# Patient Record
Sex: Female | Born: 1995 | Race: White | Hispanic: No | Marital: Single | Smoking: Never smoker
Health system: Southern US, Community
[De-identification: ages and names within clinical notes are randomized; demographics above are authoritative.]

## PROBLEM LIST (undated history)

## (undated) DIAGNOSIS — F329 Major depressive disorder, single episode, unspecified: Secondary | ICD-10-CM

## (undated) DIAGNOSIS — K829 Disease of gallbladder, unspecified: Secondary | ICD-10-CM

## (undated) DIAGNOSIS — F32A Depression, unspecified: Secondary | ICD-10-CM

## (undated) DIAGNOSIS — E669 Obesity, unspecified: Secondary | ICD-10-CM

## (undated) DIAGNOSIS — F419 Anxiety disorder, unspecified: Secondary | ICD-10-CM

## (undated) HISTORY — DX: Depression, unspecified: F32.A

## (undated) HISTORY — DX: Disease of gallbladder, unspecified: K82.9

## (undated) HISTORY — DX: Major depressive disorder, single episode, unspecified: F32.9

## (undated) HISTORY — DX: Obesity, unspecified: E66.9

## (undated) HISTORY — DX: Anxiety disorder, unspecified: F41.9

---
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
## 2016-01-11 ENCOUNTER — Ambulatory Visit (INDEPENDENT_AMBULATORY_CARE_PROVIDER_SITE_OTHER): Payer: Managed Care, Other (non HMO) | Admitting: Family

## 2016-01-11 ENCOUNTER — Encounter: Payer: Self-pay | Admitting: Family

## 2016-01-11 VITALS — BP 118/82 | HR 84 | Temp 98.2°F | Resp 14 | Ht 63.02 in | Wt 192.0 lb

## 2016-01-11 DIAGNOSIS — N946 Dysmenorrhea, unspecified: Secondary | ICD-10-CM | POA: Diagnosis not present

## 2016-01-11 DIAGNOSIS — Z23 Encounter for immunization: Secondary | ICD-10-CM | POA: Diagnosis not present

## 2016-01-11 DIAGNOSIS — K580 Irritable bowel syndrome with diarrhea: Secondary | ICD-10-CM | POA: Diagnosis not present

## 2016-01-11 NOTE — Assessment & Plan Note (Signed)
Dysmenorrhea with increasing cramping. Discussed potential options including symptom management and birth control. Wishes to continue with symptom management at this time. Continue to monitor.

## 2016-01-11 NOTE — Assessment & Plan Note (Signed)
IBS well controlled with current regimen and no adverse side effects. Continue current dosage of Viberzi and continue to monitor.

## 2016-01-11 NOTE — Patient Instructions (Addendum)
Thank you for choosing [facility].  SUMMARY AND INSTRUCTIONS:  Medication:  Please continue to take your medication as prescribed.  Your prescription(s) have been submitted to your pharmacy or been printed and provided for you. Please take as directed and contact our office if you believe you are having problem(s) with the medication(s) or have any questions.  Follow up:  If your symptoms worsen or fail to improve, please contact our office for further instruction, or in case of emergency go directly to the emergency room at the closest medical facility.    Dysmenorrhea Menstrual cramps (dysmenorrhea) are caused by the muscles of the uterus tightening (contracting) during a menstrual period. For some women, this discomfort is merely bothersome. For others, dysmenorrhea can be severe enough to interfere with everyday activities for a few days each month. Primary dysmenorrhea is menstrual cramps that last a couple of days when you start having menstrual periods or soon after. This often begins after a teenager starts having her period. As a woman gets older or has a baby, the cramps will usually lessen or disappear. Secondary dysmenorrhea begins later in life, lasts longer, and the pain may be stronger than primary dysmenorrhea. The pain may start before the period and last a few days after the period. What are the causes? Dysmenorrhea is usually caused by an underlying problem, such as:  The tissue lining the uterus grows outside of the uterus in other areas of the body (endometriosis).  The endometrial tissue, which normally lines the uterus, is found in or grows into the muscular walls of the uterus (adenomyosis).  The pelvic blood vessels are engorged with blood just before the menstrual period (pelvic congestive syndrome).  Overgrowth of cells (polyps) in the lining of the uterus or cervix.  Falling down of the uterus (prolapse) because of loose or stretched  ligaments.  Depression.  Bladder problems, infection, or inflammation.  Problems with the intestine, a tumor, or irritable bowel syndrome.  Cancer of the female organs or bladder.  A severely tipped uterus.  A very tight opening or closed cervix.  Noncancerous tumors of the uterus (fibroids).  Pelvic inflammatory disease (PID).  Pelvic scarring (adhesions) from a previous surgery.  Ovarian cyst.  An intrauterine device (IUD) used for birth control. What increases the risk? You may be at greater risk of dysmenorrhea if:  You are younger than age 19.  You started puberty early.  You have irregular or heavy bleeding.  You have never given birth.  You have a family history of this problem.  You are a smoker. What are the signs or symptoms?  Cramping or throbbing pain in your lower abdomen.  Headaches.  Lower back pain.  Nausea or vomiting.  Diarrhea.  Sweating or dizziness.  Loose stools. How is this diagnosed? A diagnosis is based on your history, symptoms, physical exam, diagnostic tests, or procedures. Diagnostic tests or procedures may include:  Blood tests.  Ultrasonography.  An examination of the lining of the uterus (dilation and curettage, D&C).  An examination inside your abdomen or pelvis with a scope (laparoscopy).  X-rays.  CT scan.  MRI.  An examination inside the bladder with a scope (cystoscopy).  An examination inside the intestine or stomach with a scope (colonoscopy, gastroscopy). How is this treated? Treatment depends on the cause of the dysmenorrhea. Treatment may include:  Pain medicine prescribed by your health care provider.  Birth control pills or an IUD with progesterone hormone in it.  Hormone replacement therapy.  Nonsteroidal anti-inflammatory drugs (NSAIDs). These may help stop the production of prostaglandins.  Surgery to remove adhesions, endometriosis, ovarian cyst, or fibroids.  Removal of the uterus  (hysterectomy).  Progesterone shots to stop the menstrual period.  Cutting the nerves on the sacrum that go to the female organs (presacral neurectomy).  Electric current to the sacral nerves (sacral nerve stimulation).  Antidepressant medicine.  Psychiatric therapy, counseling, or group therapy.  Exercise and physical therapy.  Meditation and yoga therapy.  Acupuncture. Follow these instructions at home:  Only take over-the-counter or prescription medicines as directed by your health care provider.  Place a heating pad or hot water bottle on your lower back or abdomen. Do not sleep with the heating pad.  Use aerobic exercises, walking, swimming, biking, and other exercises to help lessen the cramping.  Massage to the lower back or abdomen may help.  Stop smoking.  Avoid alcohol and caffeine. Contact a health care provider if:  Your pain does not get better with medicine.  You have pain with sexual intercourse.  Your pain increases and is not controlled with medicines.  You have abnormal vaginal bleeding with your period.  You develop nausea or vomiting with your period that is not controlled with medicine. Get help right away if: You pass out. This information is not intended to replace advice given to you by your health care provider. Make sure you discuss any questions you have with your health care provider. Document Released: 01/10/2005 Document Revised: 06/18/2015 Document Reviewed: 06/28/2012 Elsevier Interactive Patient Education  2017 [employer]

## 2016-01-11 NOTE — Progress Notes (Signed)
Subjective:    Patient ID: Patricia Anderson, female    DOB: 1995-12-17, 20 y.o.   MRN: [id number]  Chief Complaint  Patient presents with  . Follow-up    IBS, states that cramps on period have been getting alot worse,     HPI:  Patricia Anderson is a 20 y.o. female who  has a past medical history of Anxiety; Depression; Gallbladder attack; and Obesity. and presents today for a follow up office visit.  1.) IBS - Currently maintained on Viberzi which she reports taking as prescribed and denies adverse side effects. Reports that her symptoms are generally well controlled with medication once daily.   2.) Menstrual cramps - This is a new problem. Associated symptom of increasing menstrual cramps have been going on for the last couple of months that has been refractory to the modifying factors of Midol and a heating pad. Cycles have been changing increasing in length and increased heaviness. Previously 3 days long now up to 6 days with heavy bleeding early and tapering off. Cycles are generally about 28 days with some fluctuation.    No Known Allergies    Outpatient Medications Prior to Visit  Medication Sig Dispense Refill  . busPIRone (BUSPAR) 15 MG tablet Take 2 tablets by mouth in the morning and 1 tablet by mouth in the evening. 90 tablet 0  . Eluxadoline (VIBERZI) 75 MG TABS Take 75 mg by mouth 2 (two) times daily. 180 tablet 1  . FLUoxetine (PROZAC) 40 MG capsule Take 1 capsule (40 mg total) by mouth daily. 90 capsule 1  . triamcinolone ointment (KENALOG) 0.1 % Apply 1 application topically 2 (two) times daily. 30 g 0   No facility-administered medications prior to visit.       No past surgical history on file.    Past Medical History:  Diagnosis Date  . Anxiety    with depression; previous cutting.    . Depression   . Gallbladder attack   . Obesity     Review of Systems  Constitutional: Negative for chills and fever.  Gastrointestinal: Negative for abdominal  distention, abdominal pain, anal bleeding, blood in stool, constipation, diarrhea, nausea, rectal pain and vomiting.  Genitourinary: Positive for menstrual problem.      Objective:    BP 118/82 (BP Location: Left Arm, Patient Position: Sitting, Cuff Size: Normal)   Pulse 84   Temp 98.2 F (36.8 C) (Oral)   Resp 14   Ht 5' 3.02" (1.601 m)   Wt 192 lb (87.1 kg)   SpO2 98%   BMI 33.99 kg/m  Nursing note and vital signs reviewed.  Physical Exam  Constitutional: She is oriented to person, place, and time. She appears well-developed and well-nourished. No distress.  Cardiovascular: Normal rate, regular rhythm, normal heart sounds and intact distal pulses.   Pulmonary/Chest: Effort normal and breath sounds normal.  Abdominal: Normal appearance and bowel sounds are normal. There is no hepatosplenomegaly. There is no tenderness. There is no rigidity, no rebound, no guarding, no tenderness at McBurney's point and negative Murphy's sign.  Neurological: She is alert and oriented to person, place, and time.  Skin: Skin is warm and dry.  Psychiatric: She has a normal mood and affect. Her behavior is normal. Judgment and thought content normal.       Assessment & Plan:   Problem List Items Addressed This Visit      Digestive   IBS (irritable bowel syndrome) - Primary    IBS  well controlled with current regimen and no adverse side effects. Continue current dosage of Viberzi and continue to monitor.         Other   Menstrual cramps    Dysmenorrhea with increasing cramping. Discussed potential options including symptom management and birth control. Wishes to continue with symptom management at this time. Continue to monitor.        Other Visit Diagnoses    Encounter for immunization       Relevant Orders   Flu Vaccine QUAD 36+ mos IM (Completed)       I am having Ms. Gean Quintielsen maintain her Eluxadoline, busPIRone, triamcinolone ointment, and FLUoxetine.   Follow-up: Return in about  6 months (around 07/11/2016), or if symptoms worsen or fail to improve.  Jeanine Luzalone, Curtis Uriarte, FNP

## 2016-02-17 ENCOUNTER — Encounter (HOSPITAL_COMMUNITY): Payer: Self-pay | Admitting: Psychology

## 2016-02-17 NOTE — Progress Notes (Signed)
Patricia Anderson is a 21 y.o. female patient who is discharged from counseling as last seen 07/21/15 and had planned to transistion care in fall 2017 as moving to attend college .  Outpatient Therapist Discharge Summary  Patricia Anderson    [birth year]   Admission Date: 05/02/13   Discharge Date:  02/17/16 Reason for Discharge:  Not active in tx Diagnosis:  MDD, in remission and GAD  Comments:  Pt may return as needed  Alfredo BattyLeanne M Dalten Ambrosino        Patricia Anderson, [facility]

## 2016-02-22 ENCOUNTER — Telehealth: Payer: Self-pay

## 2016-02-22 NOTE — Telephone Encounter (Signed)
PA initiated for viberzi. Waiting on response.   KEY: [id number]

## 2016-02-24 NOTE — Telephone Encounter (Signed)
PA has been approved until 02/21/2017. Pharmacy is aware.

## 2016-03-13 ENCOUNTER — Other Ambulatory Visit: Payer: Self-pay | Admitting: Family

## 2016-03-13 DIAGNOSIS — F411 Generalized anxiety disorder: Secondary | ICD-10-CM

## 2016-04-18 ENCOUNTER — Other Ambulatory Visit: Payer: Self-pay | Admitting: Family

## 2016-04-18 DIAGNOSIS — F411 Generalized anxiety disorder: Secondary | ICD-10-CM

## 2016-05-13 ENCOUNTER — Ambulatory Visit: Payer: Managed Care, Other (non HMO) | Admitting: Family

## 2016-05-16 ENCOUNTER — Other Ambulatory Visit: Payer: Self-pay | Admitting: Family

## 2016-05-16 DIAGNOSIS — F411 Generalized anxiety disorder: Secondary | ICD-10-CM

## 2016-06-20 ENCOUNTER — Other Ambulatory Visit: Payer: Self-pay | Admitting: Family

## 2016-06-20 DIAGNOSIS — F411 Generalized anxiety disorder: Secondary | ICD-10-CM

## 2016-07-22 ENCOUNTER — Other Ambulatory Visit: Payer: Self-pay | Admitting: Family

## 2016-07-22 DIAGNOSIS — F411 Generalized anxiety disorder: Secondary | ICD-10-CM

## 2016-11-11 ENCOUNTER — Encounter: Payer: Managed Care, Other (non HMO) | Admitting: Family

## 2016-12-09 ENCOUNTER — Ambulatory Visit: Payer: Managed Care, Other (non HMO) | Admitting: Nurse Practitioner

## 2016-12-09 ENCOUNTER — Encounter: Payer: Self-pay | Admitting: Nurse Practitioner

## 2016-12-09 ENCOUNTER — Other Ambulatory Visit: Payer: Self-pay

## 2016-12-09 VITALS — BP 122/82 | HR 69 | Temp 98.0°F | Resp 16 | Ht 63.5 in | Wt 182.0 lb

## 2016-12-09 DIAGNOSIS — Z0000 Encounter for general adult medical examination without abnormal findings: Secondary | ICD-10-CM

## 2016-12-09 DIAGNOSIS — F411 Generalized anxiety disorder: Secondary | ICD-10-CM

## 2016-12-09 DIAGNOSIS — Z114 Encounter for screening for human immunodeficiency virus [HIV]: Secondary | ICD-10-CM | POA: Diagnosis not present

## 2016-12-09 DIAGNOSIS — F330 Major depressive disorder, recurrent, mild: Secondary | ICD-10-CM

## 2016-12-09 MED ORDER — FLUOXETINE HCL 40 MG PO CAPS: 40.0000 mg | ORAL_CAPSULE | Freq: Every day | ORAL | 1 refills | Status: AC

## 2016-12-09 NOTE — Progress Notes (Signed)
Subjective:    Patient ID: Patricia Anderson, female    DOB: 1995-05-24, 21 y.o.   MRN: [id number]  HPI Patricia Anderson is a 21 yo female Who presents today to establish care, transferring to me from another provider in the same clinic. 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 He is followed by a specialty provider in [place] that is helping him convert from female to female. He receives testosterone and routine blood work at this clinic. Patient presents today for complete physical.  Immunizations: Tetanus- up to date Flu- up to date Vision: annual Dental: bi annual Diet: Breakfast: nothing Lunch: Malawiturkey sandwich Dinner: cooks and eats out half and half Fast food 2-3 times a week; drinks: diet dr pepper, not much water Exercise: 2-3 times per week-total gym at home, jogging Sunscreen- no seatbelt - yes  Review of Systems  Constitutional: Negative.  Negative for activity change and appetite change.  HENT: Positive for congestion. Negative for dental problem.   Eyes: Negative for visual disturbance.  Respiratory: Positive for cough. Negative for shortness of breath.   Cardiovascular: Negative for chest pain and palpitations.  Gastrointestinal: Negative for constipation and diarrhea.  Endocrine: Negative for cold intolerance and heat intolerance.  Genitourinary: Negative for difficulty urinating and hematuria.  Musculoskeletal: Negative for arthralgias and myalgias.  Skin: Negative for rash.  Allergic/Immunologic: Positive for environmental allergies. Negative for food allergies.  Neurological: Negative for speech difficulty and headaches.  Hematological: Does not bruise/bleed easily.  Psychiatric/Behavioral:       Mild depression and anxiety.    Past Medical History:  Diagnosis Date  . Anxiety    with depression; previous cutting.    . Depression   . Gallbladder attack   . Obesity      Social History    Socioeconomic History  . Marital status: Single    Spouse name: Not on file  . Number of children: 0  . Years of education: 5511  . Highest education level: Not on file  Social Needs  . Financial resource strain: Not on file  . Food insecurity - worry: Not on file  . Food insecurity - inability: Not on file  . Transportation needs - medical: No  . Transportation needs - non-medical: No  Occupational History  . Occupation: Consulting civil engineertudent    Comment: [employer]  . Occupation: work    Comment: [employer]  Tobacco Use  . Smoking status: Never Smoker  . Smokeless tobacco: Never Used  Substance and Sexual Activity  . Alcohol use: No  . Drug use: No  . Sexual activity: Yes    Partners: Female    Birth control/protection: None    Comment: TG  Other Topics Concern  . Not on file  Social History Narrative   With whom does your teen live - mother   Does your teen smoke, drink alcohol, or use illegal drugs - No   Current school and grade - 11   Does not exercise   Family history - hypertension/heart disease/ high cholesterol   Mental illness and hemophelia   Have you discussed the following with your teen - sexual behavior, seat belt, bike helmet   internet use, alcohol/drug abuse, safe driving   She is caring and understanding   She is also a straight A student   FUN: school events, hang out with friends    History reviewed. No pertinent surgical history.  Family History  Problem Relation  Age of Onset  . Anxiety disorder Father   . Depression Father   . Hemophilia Father   . Depression Maternal Aunt   . Anxiety disorder Maternal Aunt   . Depression Paternal Aunt   . Anxiety disorder Paternal Aunt   . Anxiety disorder Cousin   . Depression Cousin     No Known Allergies  Current Outpatient Medications on File Prior to Visit  Medication Sig Dispense Refill  . busPIRone (BUSPAR) 15 MG tablet TAKE 2 TABLETS BY MOUTH IN THE MORNING AND 1 TABLET BY MOUTH IN THE EVENING. 90  tablet 0  . Crisaborole (EUCRISA) 2 % OINT Apply topically.    Marland Kitchen. FLUoxetine (PROZAC) 40 MG capsule Take 1 capsule (40 mg total) by mouth daily. 90 capsule 1  . testosterone cypionate (DEPOTESTOSTERONE CYPIONATE) 200 MG/ML injection Inject 200 mg every 14 (fourteen) days into the muscle. 0.6 mls every 2 weeks    . triamcinolone ointment (KENALOG) 0.1 % Apply 1 application topically 2 (two) times daily. 30 g 0   No current facility-administered medications on file prior to visit.     BP 122/82 (BP Location: Left Arm, Patient Position: Sitting, Cuff Size: Large)   Pulse 69   Temp 98 F (36.7 C) (Oral)   Resp 16   Ht 5' 3.5" (1.613 m)   Wt 182 lb (82.6 kg)   SpO2 97%   BMI 31.73 kg/m     Past Medical History:  Diagnosis Date  . Anxiety    with depression; previous cutting.    . Depression   . Gallbladder attack   . Obesity      Social History   Socioeconomic History  . Marital status: Single    Spouse name: Not on file  . Number of children: 0  . Years of education: 2411  . Highest education level: Not on file  Social Needs  . Financial resource strain: Not on file  . Food insecurity - worry: Not on file  . Food insecurity - inability: Not on file  . Transportation needs - medical: No  . Transportation needs - non-medical: No  Occupational History  . Occupation: Consulting civil engineertudent    Comment: [employer]  . Occupation: work    Comment: food [employer]  Tobacco Use  . Smoking status: Never Smoker  . Smokeless tobacco: Never Used  Substance and Sexual Activity  . Alcohol use: No  . Drug use: No  . Sexual activity: Yes    Partners: Female    Birth control/protection: None    Comment: TG  Other Topics Concern  . Not on file  Social History Narrative   With whom does your teen live - mother   Does your teen smoke, drink alcohol, or use illegal drugs - No   Current school and grade - 11   Does not exercise   Family history - hypertension/heart disease/ high cholesterol   Mental  illness and hemophelia   Have you discussed the following with your teen - sexual behavior, seat belt, bike helmet   internet use, alcohol/drug abuse, safe driving   She is caring and understanding   She is also a straight A student   FUN: school events, hang out with friends    History reviewed. No pertinent surgical history.  Family History  Problem Relation Age of Onset  . Anxiety disorder Father   . Depression Father   . Hemophilia Father   . Depression Maternal Aunt   . Anxiety disorder Maternal Aunt   .  Depression Paternal Aunt   . Anxiety disorder Paternal Aunt   . Anxiety disorder Cousin   . Depression Cousin     No Known Allergies  Current Outpatient Medications on File Prior to Visit  Medication Sig Dispense Refill  . busPIRone (BUSPAR) 15 MG tablet TAKE 2 TABLETS BY MOUTH IN THE MORNING AND 1 TABLET BY MOUTH IN THE EVENING. 90 tablet 0  . Crisaborole (EUCRISA) 2 % OINT Apply topically.    Marland Kitchen FLUoxetine (PROZAC) 40 MG capsule Take 1 capsule (40 mg total) by mouth daily. 90 capsule 1  . testosterone cypionate (DEPOTESTOSTERONE CYPIONATE) 200 MG/ML injection Inject 200 mg every 14 (fourteen) days into the muscle. 0.6 mls every 2 weeks    . triamcinolone ointment (KENALOG) 0.1 % Apply 1 application topically 2 (two) times daily. 30 g 0   No current facility-administered medications on file prior to visit.        Objective:   Physical Exam  BP 122/82 (BP Location: Left Arm, Patient Position: Sitting, Cuff Size: Large)   Pulse 69   Temp 98 F (36.7 C) (Oral)   Resp 16   Ht 5' 3.5" (1.613 m)   Wt 182 lb (82.6 kg)   SpO2 97%   BMI 31.73 kg/m   General Appearance:    Alert, cooperative, no distress, appears stated age  Head:    Normocephalic, without obvious abnormality, atraumatic  Eyes:    PERRL, conjunctiva/corneas clear, EOM's intact  Ears:    Normal TM's and external ear canals, both ears  Nose:   Nares normal, septum midline, mucosa normal, no drainage     or sinus tenderness  Throat:   Lips, mucosa, and tongue normal; teeth and gums normal  Neck:   Supple, symmetrical, trachea midline, no adenopathy;    thyroid:  no enlargement/tenderness/nodules  Back:     Symmetric, no curvature, ROM normal, no CVA tenderness  Lungs:     Clear to auscultation bilaterally, respirations unlabored  Chest Wall:    No tenderness or deformity   Heart:    Regular rate and rhythm, heart sounds normal, no murmur, rub or gallop     Abdomen:     Soft, non-tender, bowel sounds active all four quadrants,    no masses, no organomegaly        Extremities:   Extremities normal, atraumatic, no cyanosis or edema  Pulses:   2+ and symmetric all extremities  Skin:   Skin color, texture, turgor normal, no rashes or lesions  Lymph nodes:   Cervical, supraclavicular nodes normal  Neurologic:   CNII-XII intact, normal strength, sensation and reflexes    throughout        Assessment & Plan:

## 2016-12-09 NOTE — Assessment & Plan Note (Signed)
Immunizations up to date. HIV antibody today-ordered through [facility] per patient request. Health maintenance up to date.  Healthy diet and exercise discussed including reduced 1/2 plate of veggies at meals, getting 150 minutes of exercise per week, increased water intake, and not skipping meals. Sunscreen, seatbelt, smoking cessation discussed. He currently vapes is thinking of quitting.  Preventive care handout given.  Hell return in 1 year for CPE or sooner if needed

## 2016-12-09 NOTE — Patient Instructions (Signed)
I have printed your lab order to take to [facility]. They should fax the results back to me.  Please work on your diet and exercise as we discussed. Remember half of your plate should be veggies, one-fourth carbs, one-fourth meat, and don't eat meat at every meal. Also, remember to stay away from sugary drinks. I'd like for you to start incorporating exercise into your daily schedule. Start at 10 minutes a day, working up to 30 minutes five times a week.  It was very nice to meet you. Thanks for letting me take care of you today :)   Preventive Care 18-39 Years Preventive care refers to lifestyle choices and visits with your health care provider that can promote health and wellness. What does preventive care include?  A yearly physical exam. This is also called an annual well check.  Dental exams once or twice a year.  Routine eye exams. Ask your health care provider how often you should have your eyes checked.  Personal lifestyle choices, including: ? Daily care of your teeth and gums. ? Regular physical activity. ? Eating a healthy diet. ? Avoiding tobacco and drug use. ? Limiting alcohol use. ? Practicing safe sex. ? Taking vitamin and mineral supplements as recommended by your health care provider. What happens during an annual well check? The services and screenings done by your health care provider during your annual well check will depend on your age, overall health, lifestyle risk factors, and family history of disease. Counseling Your health care provider may ask you questions about your:  Alcohol use.  Tobacco use.  Drug use.  Emotional well-being.  Home and relationship well-being.  Sexual activity.  Eating habits.  Work and work Statistician.  Method of birth control.  Menstrual cycle.  Pregnancy history.  Screening You may have the following tests or measurements:  Height, weight, and BMI.  Diabetes screening. This is done by checking your blood  sugar (glucose) after you have not eaten for a while (fasting).  Blood pressure.  Lipid and cholesterol levels. These may be checked every 5 years starting at age 29.  Skin check.  Hepatitis C blood test.  Hepatitis B blood test.  Sexually transmitted disease (STD) testing.  BRCA-related cancer screening. This may be done if you have a family history of breast, ovarian, tubal, or peritoneal cancers.  Pelvic exam and Pap test. This may be done every 3 years starting at age 74. Starting at age 73, this may be done every 5 years if you have a Pap test in combination with an HPV test.  Discuss your test results, treatment options, and if necessary, the need for more tests with your health care provider. Vaccines Your health care provider may recommend certain vaccines, such as:  Influenza vaccine. This is recommended every year.  Tetanus, diphtheria, and acellular pertussis (Tdap, Td) vaccine. You may need a Td booster every 10 years.  Varicella vaccine. You may need this if you have not been vaccinated.  HPV vaccine. If you are 70 or younger, you may need three doses over 6 months.  Measles, mumps, and rubella (MMR) vaccine. You may need at least one dose of MMR. You may also need a second dose.  Pneumococcal 13-valent conjugate (PCV13) vaccine. You may need this if you have certain conditions and were not previously vaccinated.  Pneumococcal polysaccharide (PPSV23) vaccine. You may need one or two doses if you smoke cigarettes or if you have certain conditions.  Meningococcal vaccine. One dose is recommended  if you are age 15-21 years and a first-year college student living in a residence hall, or if you have one of several medical conditions. You may also need additional booster doses.  Hepatitis A vaccine. You may need this if you have certain conditions or if you travel or work in places where you may be exposed to hepatitis A.  Hepatitis B vaccine. You may need this if you  have certain conditions or if you travel or work in places where you may be exposed to hepatitis B.  Haemophilus influenzae type b (Hib) vaccine. You may need this if you have certain risk factors.  Talk to your health care provider about which screenings and vaccines you need and how often you need them. This information is not intended to replace advice given to you by your health care provider. Make sure you discuss any questions you have with your health care provider. Document Released: 03/08/2001 Document Revised: 09/30/2015 Document Reviewed: 11/11/2014 Elsevier Interactive Patient Education  2017 [employer].

## 2017-02-17 ENCOUNTER — Telehealth: Payer: Self-pay | Admitting: Family

## 2017-02-17 NOTE — Telephone Encounter (Signed)
Just a reminder that he and [name] had talked about getting HIV test at [facility] per his request; still have not gotten results here. If already had done with other provider in [place], no need for further follow-up.

## 2018-02-24 IMAGING — CR DG FOOT COMPLETE 3+V*L*
3 series · 3 of 3 positions shown · non-contrast
Comparison: None.

CLINICAL DATA: Acute left foot pain after twisting injury today.
Initial encounter.

EXAM:
LEFT FOOT - COMPLETE 3+ VIEW

[x foot ap left]
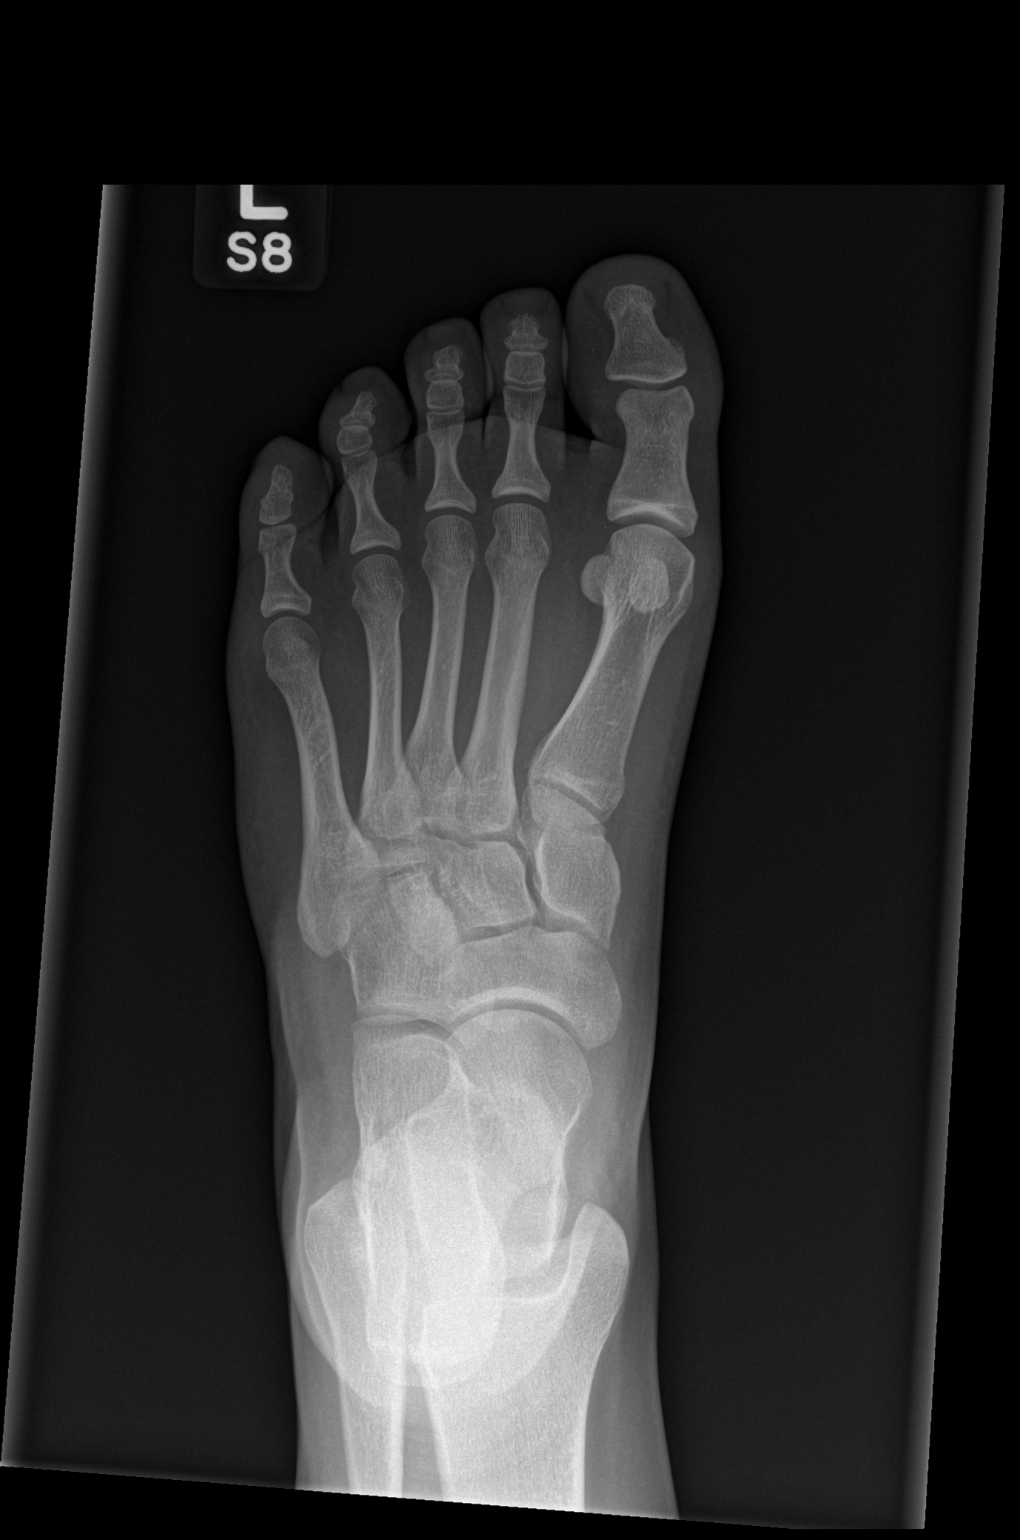

[x foot obl left]
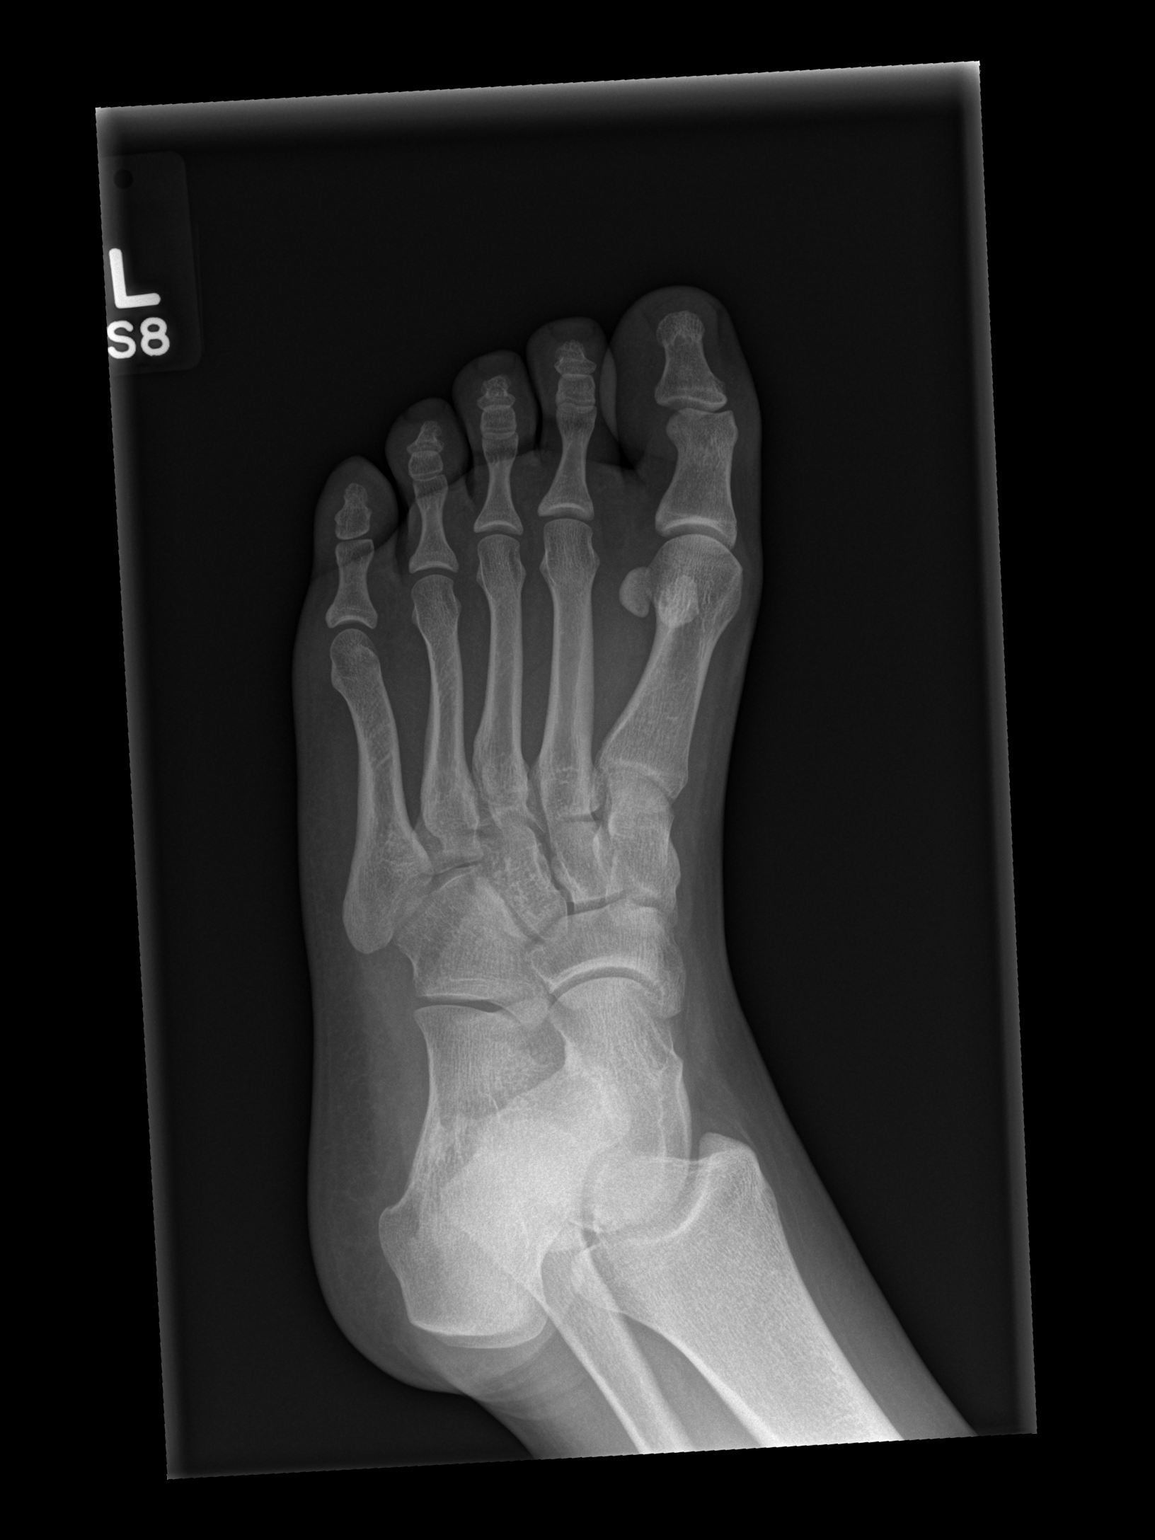

[x foot lat left]
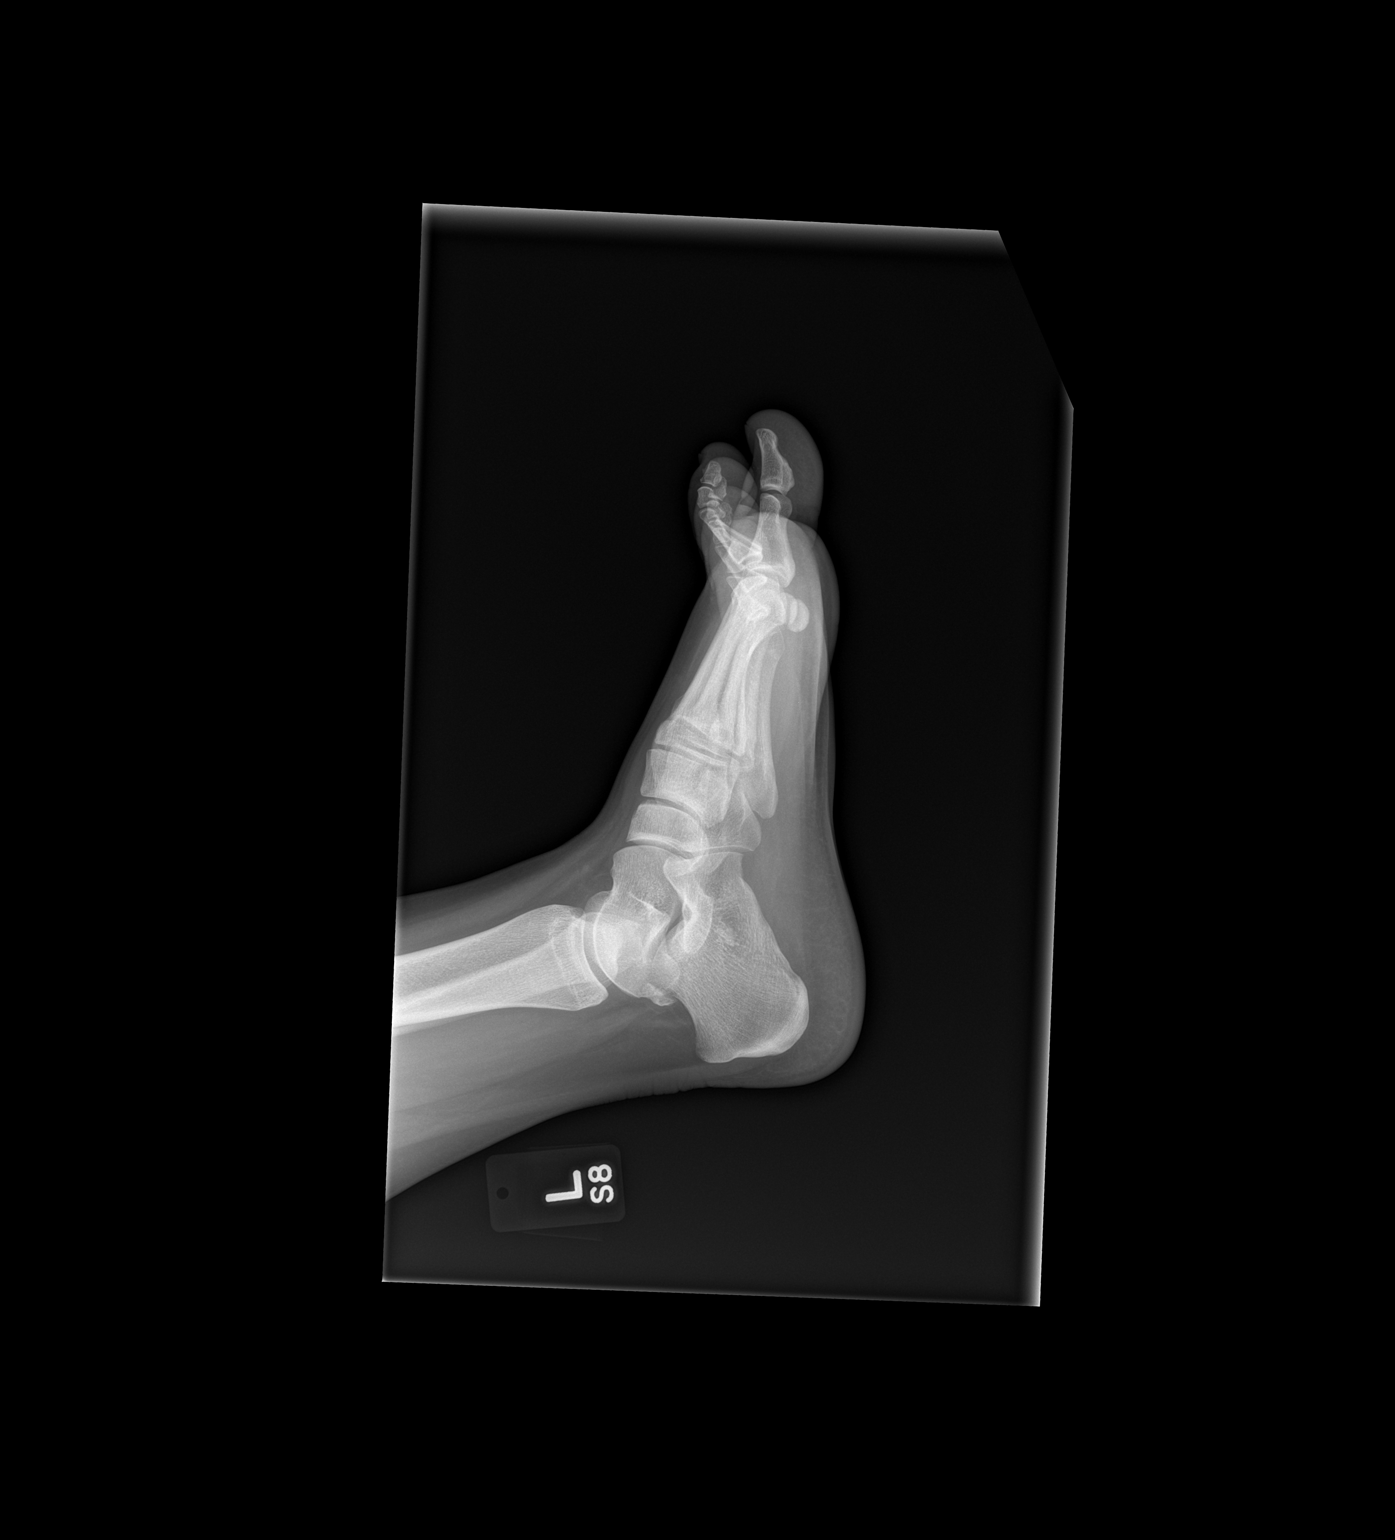

[3 of 3 positions shown; findings below may reference images not displayed]

FINDINGS: There is no evidence of fracture or dislocation. There is no
evidence of arthropathy or other focal bone abnormality. Soft
tissues are unremarkable.
IMPRESSION: Normal left foot.

## 2018-02-24 IMAGING — CR DG ANKLE COMPLETE 3+V*L*
3 series · 3 of 3 positions shown · non-contrast
Comparison: None.

CLINICAL DATA: Fall with left ankle and foot pain and swelling.

EXAM:
LEFT ANKLE COMPLETE - 3+ VIEW

[x ankle ap left]
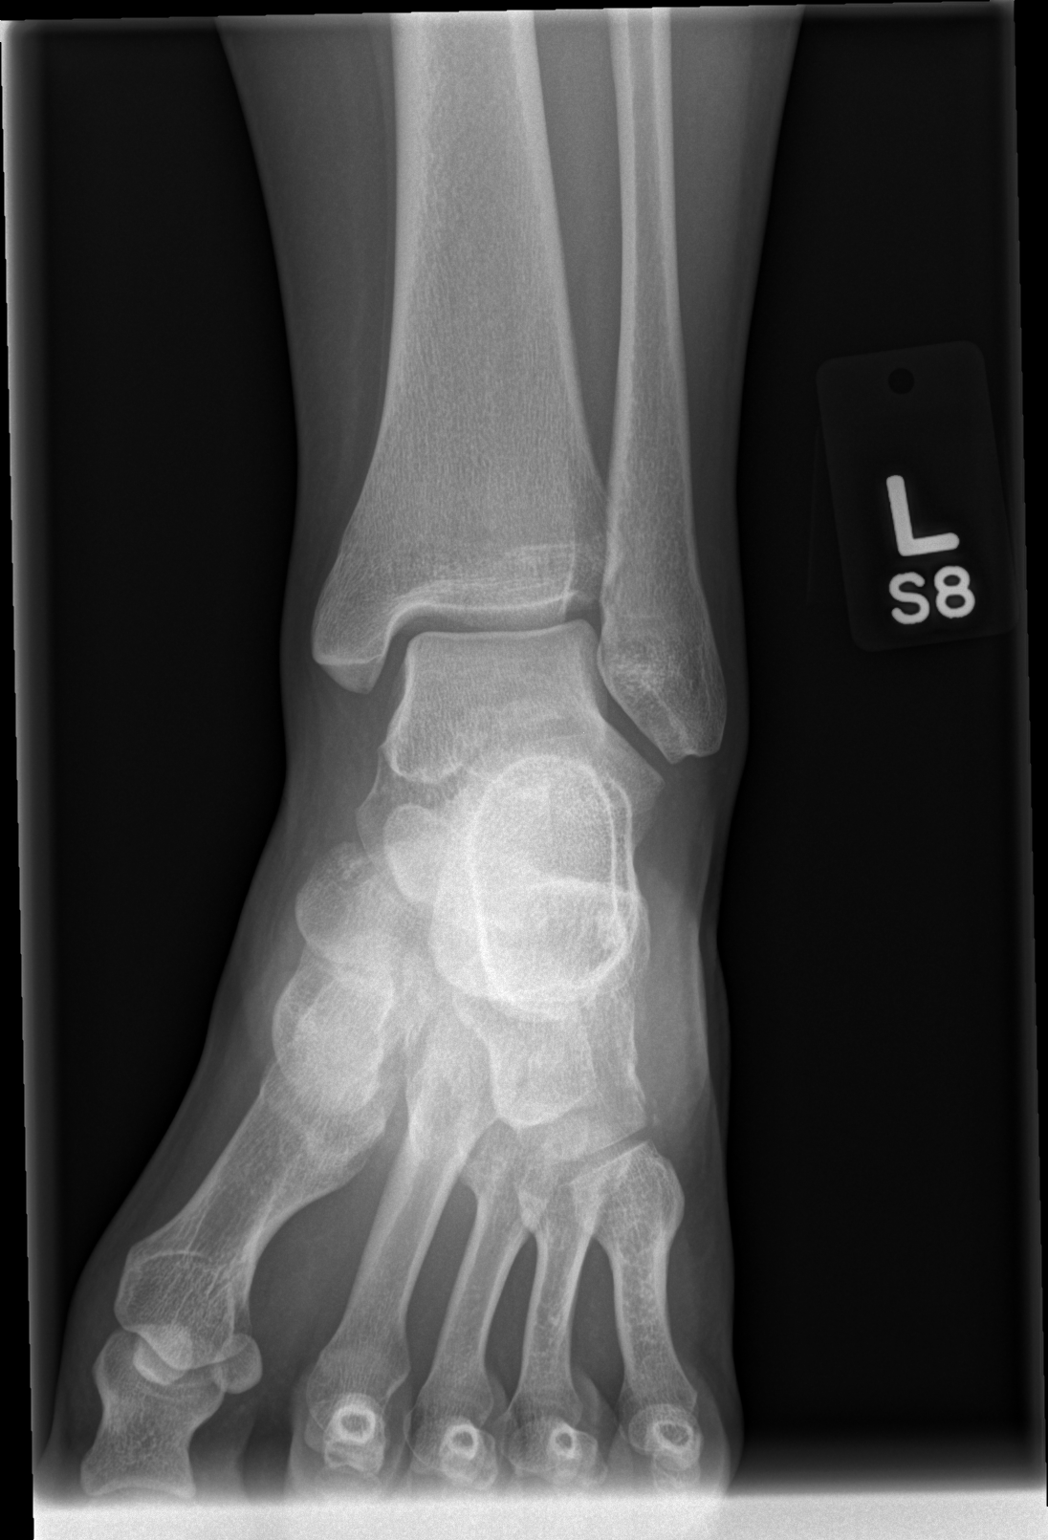

[x ankle obl left]
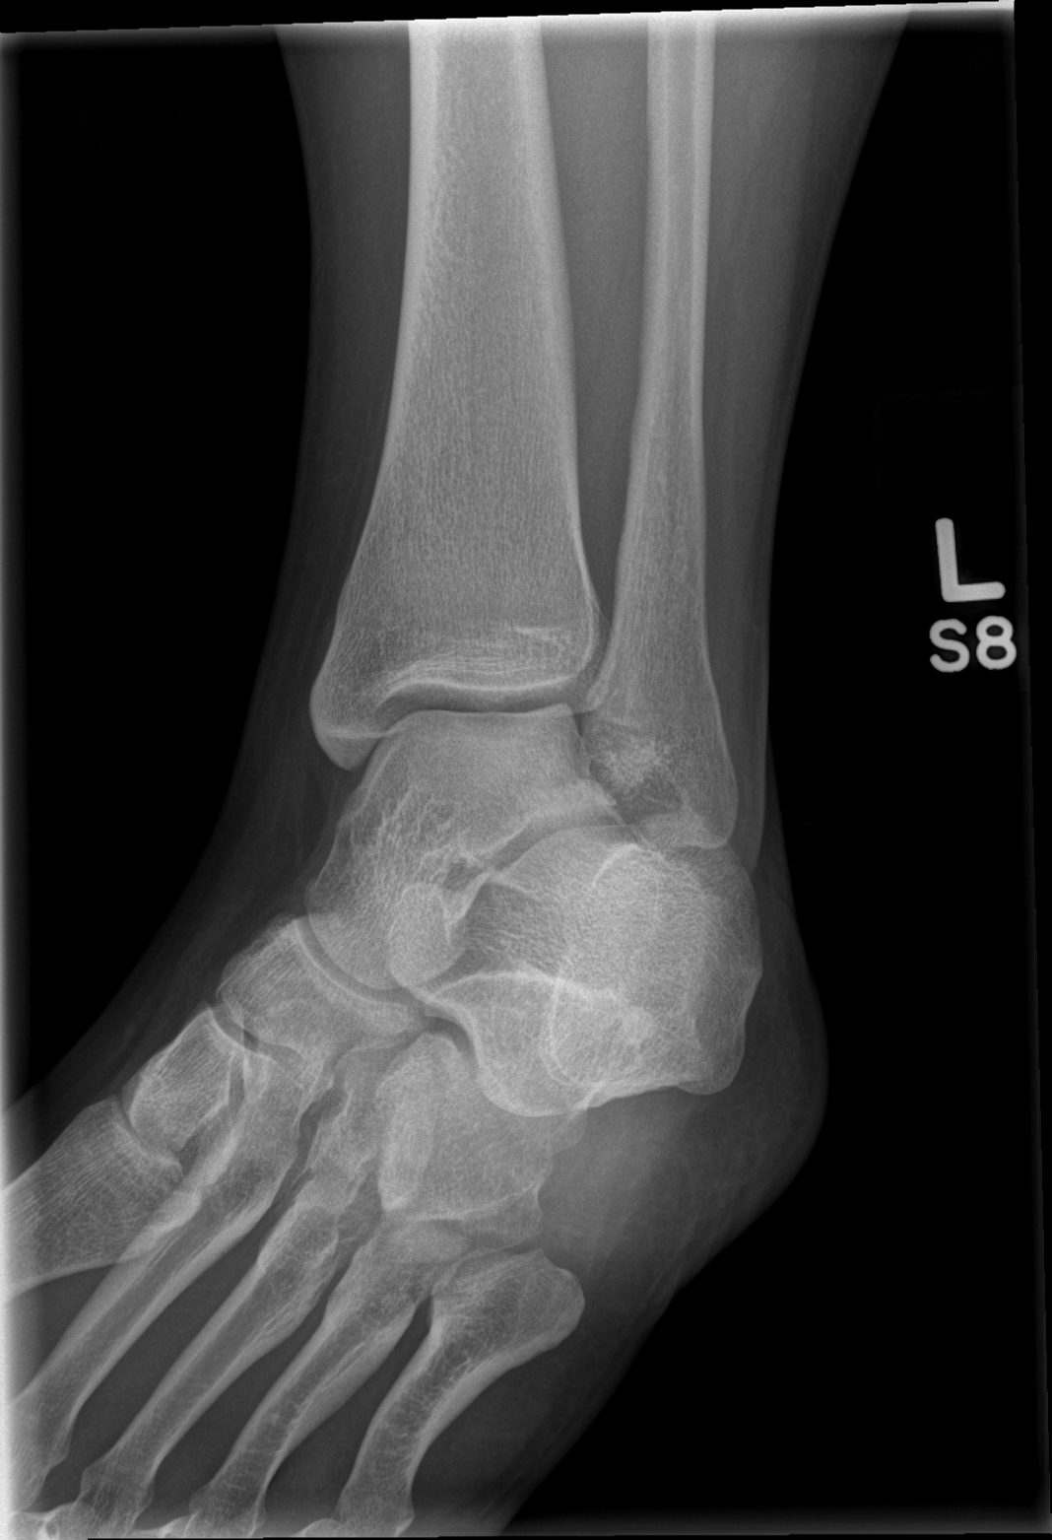

[x ankle lat left]
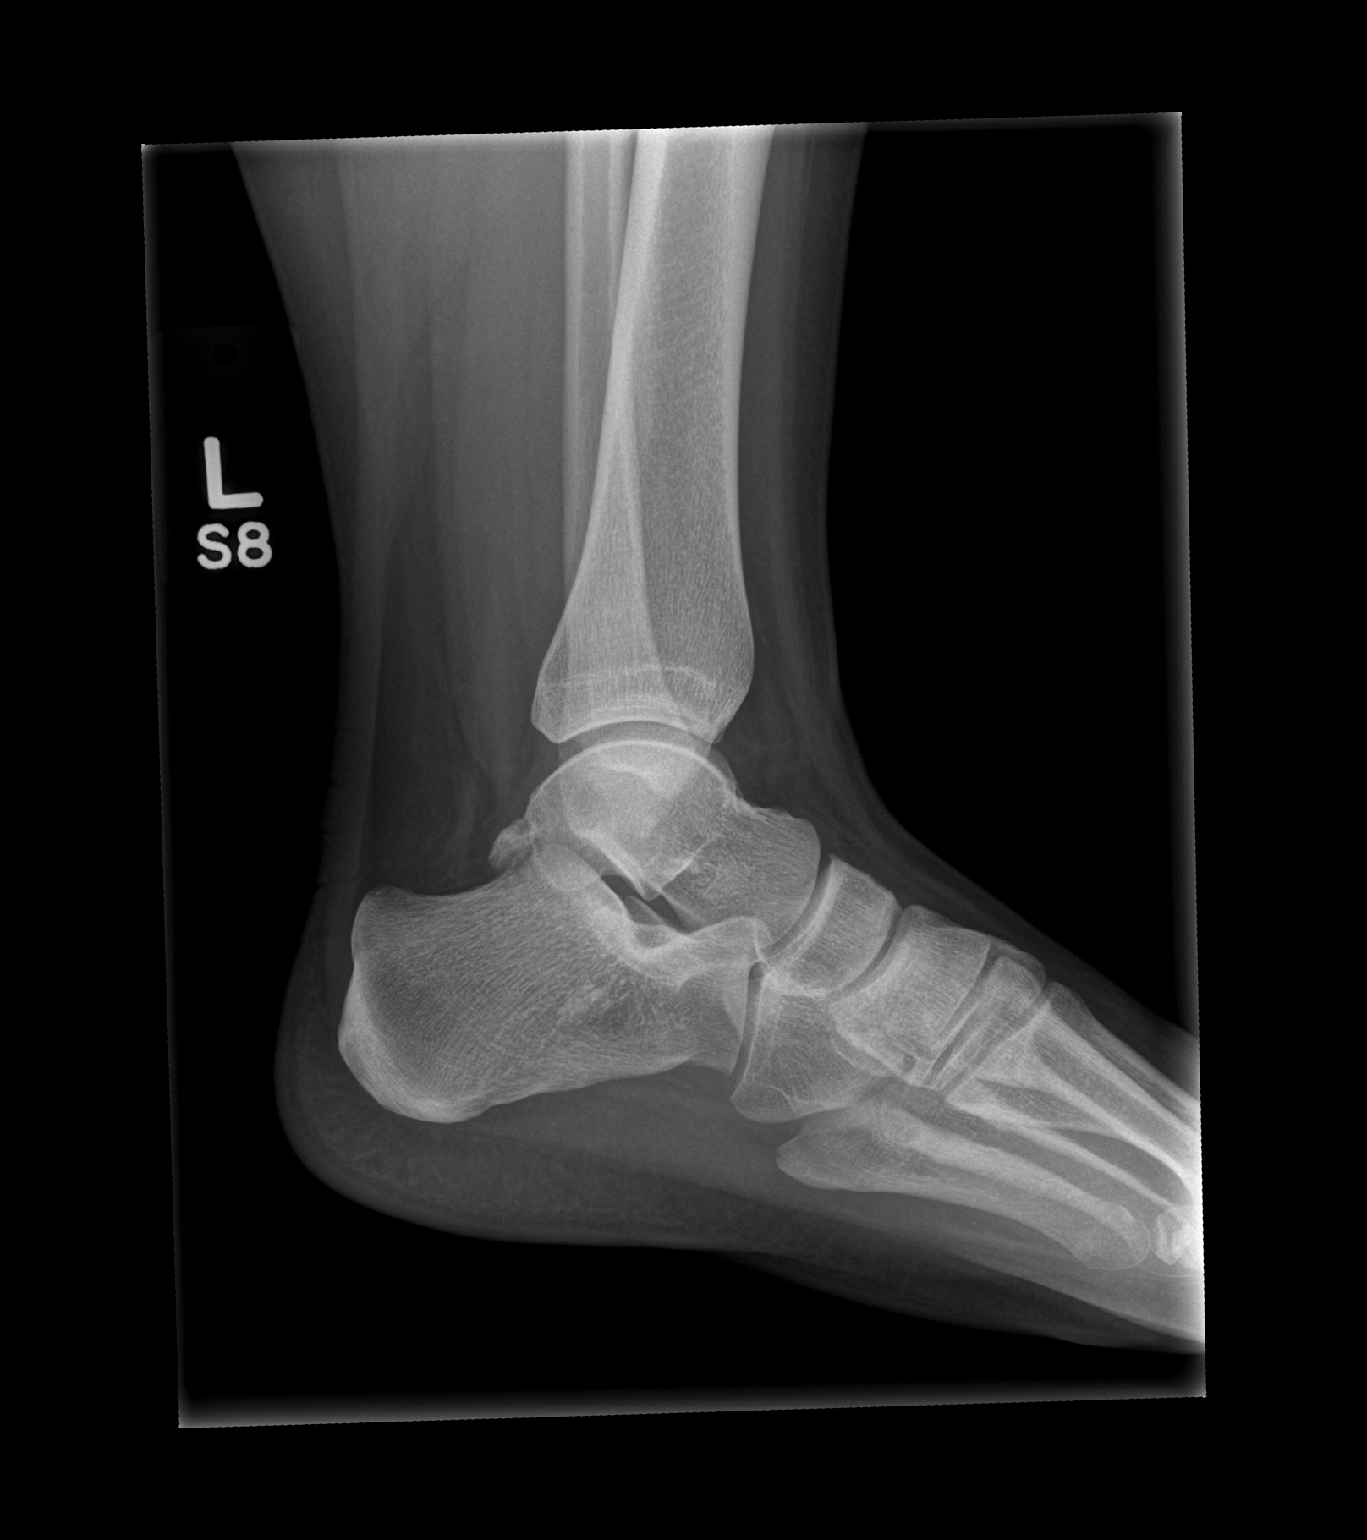

[3 of 3 positions shown; findings below may reference images not displayed]

FINDINGS: There is no evidence of fracture, dislocation, or joint effusion.
There is no evidence of arthropathy or other focal bone abnormality.
Soft tissues are unremarkable.
IMPRESSION: Negative.

## 2019-02-14 ENCOUNTER — Telehealth: Payer: Self-pay

## 2019-02-14 NOTE — Telephone Encounter (Signed)
New patient    The patient seen Alphonse Guild in  2018.  Asking will [facility] take him on as a patient.   The patient is out of medication.

## 2019-02-15 NOTE — Telephone Encounter (Signed)
I would recommend that Patricia Anderson look at other clinics right now that have more availability.
